# Patient Record
Sex: Female | Born: 1960 | Race: White | Hispanic: No | Marital: Married | Smoking: Never smoker
Health system: Southern US, Community
[De-identification: ages and names within clinical notes are randomized; demographics above are authoritative.]

## PROBLEM LIST (undated history)

## (undated) DIAGNOSIS — M069 Rheumatoid arthritis, unspecified: Secondary | ICD-10-CM

## (undated) DIAGNOSIS — R9389 Abnormal findings on diagnostic imaging of other specified body structures: Secondary | ICD-10-CM

## (undated) DIAGNOSIS — Z973 Presence of spectacles and contact lenses: Secondary | ICD-10-CM

## (undated) DIAGNOSIS — E079 Disorder of thyroid, unspecified: Secondary | ICD-10-CM

## (undated) HISTORY — PX: OTHER SURGICAL HISTORY: SHX169

---

## 1999-03-13 ENCOUNTER — Other Ambulatory Visit: Admission: RE | Admit: 1999-03-13 | Discharge: 1999-03-13 | Payer: Self-pay | Admitting: Obstetrics and Gynecology

## 2000-04-14 ENCOUNTER — Other Ambulatory Visit: Admission: RE | Admit: 2000-04-14 | Discharge: 2000-04-14 | Payer: Self-pay | Admitting: Obstetrics and Gynecology

## 2001-06-05 ENCOUNTER — Encounter: Payer: Self-pay | Admitting: Obstetrics and Gynecology

## 2001-06-05 ENCOUNTER — Encounter: Admission: RE | Admit: 2001-06-05 | Discharge: 2001-06-05 | Payer: Self-pay | Admitting: Obstetrics and Gynecology

## 2002-06-30 ENCOUNTER — Encounter: Payer: Self-pay | Admitting: Obstetrics and Gynecology

## 2002-06-30 ENCOUNTER — Encounter: Admission: RE | Admit: 2002-06-30 | Discharge: 2002-06-30 | Payer: Self-pay | Admitting: Obstetrics and Gynecology

## 2003-06-01 ENCOUNTER — Other Ambulatory Visit: Admission: RE | Admit: 2003-06-01 | Discharge: 2003-06-01 | Payer: Self-pay | Admitting: Obstetrics and Gynecology

## 2003-07-22 ENCOUNTER — Encounter: Admission: RE | Admit: 2003-07-22 | Discharge: 2003-07-22 | Payer: Self-pay | Admitting: Obstetrics and Gynecology

## 2004-07-18 ENCOUNTER — Other Ambulatory Visit: Admission: RE | Admit: 2004-07-18 | Discharge: 2004-07-18 | Payer: Self-pay | Admitting: Obstetrics and Gynecology

## 2004-08-01 ENCOUNTER — Encounter: Admission: RE | Admit: 2004-08-01 | Discharge: 2004-08-01 | Payer: Self-pay | Admitting: Obstetrics and Gynecology

## 2005-05-14 ENCOUNTER — Emergency Department (HOSPITAL_COMMUNITY): Admission: EM | Admit: 2005-05-14 | Discharge: 2005-05-15 | Payer: Self-pay | Admitting: Emergency Medicine

## 2005-07-23 ENCOUNTER — Other Ambulatory Visit: Admission: RE | Admit: 2005-07-23 | Discharge: 2005-07-23 | Payer: Self-pay | Admitting: Obstetrics and Gynecology

## 2005-08-12 ENCOUNTER — Encounter: Admission: RE | Admit: 2005-08-12 | Discharge: 2005-08-12 | Payer: Self-pay | Admitting: Obstetrics and Gynecology

## 2006-08-05 ENCOUNTER — Other Ambulatory Visit: Admission: RE | Admit: 2006-08-05 | Discharge: 2006-08-05 | Payer: Self-pay | Admitting: Obstetrics and Gynecology

## 2006-08-18 ENCOUNTER — Encounter: Admission: RE | Admit: 2006-08-18 | Discharge: 2006-08-18 | Payer: Self-pay | Admitting: Obstetrics and Gynecology

## 2007-08-06 ENCOUNTER — Other Ambulatory Visit: Admission: RE | Admit: 2007-08-06 | Discharge: 2007-08-06 | Payer: Self-pay | Admitting: Obstetrics and Gynecology

## 2007-08-19 ENCOUNTER — Encounter: Admission: RE | Admit: 2007-08-19 | Discharge: 2007-08-19 | Payer: Self-pay | Admitting: Obstetrics and Gynecology

## 2008-08-08 ENCOUNTER — Other Ambulatory Visit: Admission: RE | Admit: 2008-08-08 | Discharge: 2008-08-08 | Payer: Self-pay | Admitting: Obstetrics and Gynecology

## 2008-08-22 ENCOUNTER — Encounter: Admission: RE | Admit: 2008-08-22 | Discharge: 2008-08-22 | Payer: Self-pay | Admitting: Obstetrics and Gynecology

## 2009-08-24 ENCOUNTER — Encounter: Admission: RE | Admit: 2009-08-24 | Discharge: 2009-08-24 | Payer: Self-pay | Admitting: Obstetrics and Gynecology

## 2009-08-29 ENCOUNTER — Other Ambulatory Visit: Admission: RE | Admit: 2009-08-29 | Discharge: 2009-08-29 | Payer: Self-pay | Admitting: Obstetrics and Gynecology

## 2010-08-01 ENCOUNTER — Other Ambulatory Visit: Payer: Self-pay | Admitting: Obstetrics and Gynecology

## 2010-08-01 DIAGNOSIS — Z1231 Encounter for screening mammogram for malignant neoplasm of breast: Secondary | ICD-10-CM

## 2010-08-28 ENCOUNTER — Ambulatory Visit: Payer: Self-pay

## 2010-08-28 ENCOUNTER — Ambulatory Visit
Admission: RE | Admit: 2010-08-28 | Discharge: 2010-08-28 | Disposition: A | Discharge status: Home / Self Care | Payer: Self-pay | Source: Ambulatory Visit | Attending: Obstetrics and Gynecology | Admitting: Obstetrics and Gynecology

## 2010-08-28 DIAGNOSIS — Z1231 Encounter for screening mammogram for malignant neoplasm of breast: Secondary | ICD-10-CM

## 2010-10-26 ENCOUNTER — Other Ambulatory Visit (HOSPITAL_COMMUNITY)
Admission: RE | Admit: 2010-10-26 | Discharge: 2010-10-26 | Disposition: A | Discharge status: Home / Self Care | Payer: Managed Care, Other (non HMO) | Source: Ambulatory Visit | Attending: Obstetrics and Gynecology | Admitting: Obstetrics and Gynecology

## 2010-10-26 ENCOUNTER — Other Ambulatory Visit: Payer: Self-pay | Admitting: Obstetrics and Gynecology

## 2010-10-26 DIAGNOSIS — Z01419 Encounter for gynecological examination (general) (routine) without abnormal findings: Secondary | ICD-10-CM | POA: Insufficient documentation

## 2011-08-05 ENCOUNTER — Other Ambulatory Visit: Payer: Self-pay | Admitting: Obstetrics and Gynecology

## 2011-08-05 DIAGNOSIS — Z1231 Encounter for screening mammogram for malignant neoplasm of breast: Secondary | ICD-10-CM

## 2011-08-29 ENCOUNTER — Ambulatory Visit
Admission: RE | Admit: 2011-08-29 | Discharge: 2011-08-29 | Disposition: A | Discharge status: Home / Self Care | Payer: Managed Care, Other (non HMO) | Source: Ambulatory Visit | Attending: Obstetrics and Gynecology | Admitting: Obstetrics and Gynecology

## 2011-08-29 DIAGNOSIS — Z1231 Encounter for screening mammogram for malignant neoplasm of breast: Secondary | ICD-10-CM

## 2011-11-05 ENCOUNTER — Other Ambulatory Visit: Payer: Self-pay | Admitting: Obstetrics and Gynecology

## 2011-11-05 ENCOUNTER — Other Ambulatory Visit (HOSPITAL_COMMUNITY)
Admission: RE | Admit: 2011-11-05 | Discharge: 2011-11-05 | Disposition: A | Discharge status: Home / Self Care | Payer: Managed Care, Other (non HMO) | Source: Ambulatory Visit | Attending: Obstetrics and Gynecology | Admitting: Obstetrics and Gynecology

## 2011-11-05 DIAGNOSIS — Z01419 Encounter for gynecological examination (general) (routine) without abnormal findings: Secondary | ICD-10-CM | POA: Insufficient documentation

## 2012-08-03 ENCOUNTER — Other Ambulatory Visit: Payer: Self-pay

## 2012-08-03 DIAGNOSIS — Z1231 Encounter for screening mammogram for malignant neoplasm of breast: Secondary | ICD-10-CM

## 2012-09-02 ENCOUNTER — Ambulatory Visit: Payer: Managed Care, Other (non HMO)

## 2012-09-15 ENCOUNTER — Ambulatory Visit
Admission: RE | Admit: 2012-09-15 | Discharge: 2012-09-15 | Disposition: A | Discharge status: Home / Self Care | Payer: Managed Care, Other (non HMO) | Source: Ambulatory Visit

## 2012-09-15 DIAGNOSIS — Z1231 Encounter for screening mammogram for malignant neoplasm of breast: Secondary | ICD-10-CM

## 2012-11-05 ENCOUNTER — Other Ambulatory Visit: Payer: Self-pay | Admitting: Obstetrics and Gynecology

## 2012-11-05 ENCOUNTER — Other Ambulatory Visit (HOSPITAL_COMMUNITY)
Admission: RE | Admit: 2012-11-05 | Discharge: 2012-11-05 | Disposition: A | Discharge status: Home / Self Care | Payer: Managed Care, Other (non HMO) | Source: Ambulatory Visit | Attending: Obstetrics and Gynecology | Admitting: Obstetrics and Gynecology

## 2012-11-05 DIAGNOSIS — Z01419 Encounter for gynecological examination (general) (routine) without abnormal findings: Secondary | ICD-10-CM | POA: Insufficient documentation

## 2012-11-05 DIAGNOSIS — Z1151 Encounter for screening for human papillomavirus (HPV): Secondary | ICD-10-CM | POA: Insufficient documentation

## 2013-08-24 ENCOUNTER — Other Ambulatory Visit: Payer: Self-pay

## 2013-08-24 DIAGNOSIS — Z1231 Encounter for screening mammogram for malignant neoplasm of breast: Secondary | ICD-10-CM

## 2013-09-16 ENCOUNTER — Ambulatory Visit
Admission: RE | Admit: 2013-09-16 | Discharge: 2013-09-16 | Disposition: A | Discharge status: Home / Self Care | Payer: Managed Care, Other (non HMO) | Source: Ambulatory Visit

## 2013-09-16 ENCOUNTER — Encounter (INDEPENDENT_AMBULATORY_CARE_PROVIDER_SITE_OTHER): Payer: Self-pay

## 2013-09-16 DIAGNOSIS — Z1231 Encounter for screening mammogram for malignant neoplasm of breast: Secondary | ICD-10-CM

## 2013-11-05 ENCOUNTER — Other Ambulatory Visit (HOSPITAL_COMMUNITY)
Admission: RE | Admit: 2013-11-05 | Discharge: 2013-11-05 | Disposition: A | Discharge status: Home / Self Care | Payer: Managed Care, Other (non HMO) | Source: Ambulatory Visit | Attending: Obstetrics and Gynecology | Admitting: Obstetrics and Gynecology

## 2013-11-05 ENCOUNTER — Other Ambulatory Visit: Payer: Self-pay | Admitting: Obstetrics and Gynecology

## 2013-11-05 DIAGNOSIS — Z01419 Encounter for gynecological examination (general) (routine) without abnormal findings: Secondary | ICD-10-CM | POA: Insufficient documentation

## 2013-11-09 LAB — CYTOLOGY - PAP

## 2014-02-24 ENCOUNTER — Ambulatory Visit
Admission: RE | Admit: 2014-02-24 | Discharge: 2014-02-24 | Disposition: A | Discharge status: Home / Self Care | Payer: Managed Care, Other (non HMO) | Source: Ambulatory Visit | Attending: Family Medicine | Admitting: Family Medicine

## 2014-02-24 ENCOUNTER — Other Ambulatory Visit: Payer: Self-pay | Admitting: Family Medicine

## 2014-02-24 DIAGNOSIS — R590 Localized enlarged lymph nodes: Secondary | ICD-10-CM

## 2014-02-24 MED ORDER — IOHEXOL 300 MG/ML  SOLN
75.0000 mL | Freq: Once | INTRAMUSCULAR | Status: AC | PRN
  Administered 2014-02-24: 75 mL via INTRAVENOUS

## 2014-08-15 ENCOUNTER — Other Ambulatory Visit: Payer: Self-pay

## 2014-08-15 DIAGNOSIS — Z1231 Encounter for screening mammogram for malignant neoplasm of breast: Secondary | ICD-10-CM

## 2014-09-21 ENCOUNTER — Ambulatory Visit
Admission: RE | Admit: 2014-09-21 | Discharge: 2014-09-21 | Disposition: A | Discharge status: Home / Self Care | Payer: 59 | Source: Ambulatory Visit

## 2014-09-21 ENCOUNTER — Encounter (INDEPENDENT_AMBULATORY_CARE_PROVIDER_SITE_OTHER): Payer: Self-pay

## 2014-09-21 DIAGNOSIS — Z1231 Encounter for screening mammogram for malignant neoplasm of breast: Secondary | ICD-10-CM

## 2015-08-22 ENCOUNTER — Other Ambulatory Visit: Payer: Self-pay

## 2015-08-22 DIAGNOSIS — Z1231 Encounter for screening mammogram for malignant neoplasm of breast: Secondary | ICD-10-CM

## 2015-10-03 ENCOUNTER — Ambulatory Visit
Admission: RE | Admit: 2015-10-03 | Discharge: 2015-10-03 | Disposition: A | Discharge status: Home / Self Care | Payer: 59 | Source: Ambulatory Visit

## 2015-10-03 DIAGNOSIS — Z1231 Encounter for screening mammogram for malignant neoplasm of breast: Secondary | ICD-10-CM

## 2016-08-23 ENCOUNTER — Other Ambulatory Visit: Payer: Self-pay | Admitting: Obstetrics and Gynecology

## 2016-08-23 DIAGNOSIS — Z1231 Encounter for screening mammogram for malignant neoplasm of breast: Secondary | ICD-10-CM

## 2016-10-23 ENCOUNTER — Ambulatory Visit
Admission: RE | Admit: 2016-10-23 | Discharge: 2016-10-23 | Disposition: A | Discharge status: Home / Self Care | Payer: 59 | Source: Ambulatory Visit | Attending: Obstetrics and Gynecology | Admitting: Obstetrics and Gynecology

## 2016-10-23 DIAGNOSIS — Z1231 Encounter for screening mammogram for malignant neoplasm of breast: Secondary | ICD-10-CM

## 2016-11-06 ENCOUNTER — Other Ambulatory Visit (HOSPITAL_COMMUNITY)
Admission: RE | Admit: 2016-11-06 | Discharge: 2016-11-06 | Disposition: A | Discharge status: Home / Self Care | Payer: 59 | Source: Ambulatory Visit | Attending: Obstetrics and Gynecology | Admitting: Obstetrics and Gynecology

## 2016-11-06 ENCOUNTER — Other Ambulatory Visit: Payer: Self-pay | Admitting: Obstetrics and Gynecology

## 2016-11-06 DIAGNOSIS — Z01419 Encounter for gynecological examination (general) (routine) without abnormal findings: Secondary | ICD-10-CM | POA: Diagnosis not present

## 2016-11-07 LAB — CYTOLOGY - PAP
DIAGNOSIS: NEGATIVE
HPV: NOT DETECTED

## 2017-09-15 ENCOUNTER — Other Ambulatory Visit: Payer: Self-pay | Admitting: Obstetrics and Gynecology

## 2017-09-15 DIAGNOSIS — Z1231 Encounter for screening mammogram for malignant neoplasm of breast: Secondary | ICD-10-CM

## 2017-10-24 ENCOUNTER — Ambulatory Visit
Admission: RE | Admit: 2017-10-24 | Discharge: 2017-10-24 | Disposition: A | Discharge status: Home / Self Care | Payer: 59 | Source: Ambulatory Visit | Attending: Obstetrics and Gynecology | Admitting: Obstetrics and Gynecology

## 2017-10-24 DIAGNOSIS — Z1231 Encounter for screening mammogram for malignant neoplasm of breast: Secondary | ICD-10-CM

## 2018-09-18 ENCOUNTER — Other Ambulatory Visit: Payer: Self-pay | Admitting: Obstetrics and Gynecology

## 2018-09-18 DIAGNOSIS — Z1231 Encounter for screening mammogram for malignant neoplasm of breast: Secondary | ICD-10-CM

## 2018-10-27 ENCOUNTER — Ambulatory Visit: Payer: 59

## 2018-10-27 ENCOUNTER — Ambulatory Visit
Admission: RE | Admit: 2018-10-27 | Discharge: 2018-10-27 | Disposition: A | Discharge status: Home / Self Care | Payer: 59 | Source: Ambulatory Visit | Attending: Obstetrics and Gynecology | Admitting: Obstetrics and Gynecology

## 2018-10-27 ENCOUNTER — Other Ambulatory Visit: Payer: Self-pay

## 2018-10-27 DIAGNOSIS — Z1231 Encounter for screening mammogram for malignant neoplasm of breast: Secondary | ICD-10-CM

## 2018-10-29 ENCOUNTER — Other Ambulatory Visit: Payer: Self-pay | Admitting: Obstetrics and Gynecology

## 2018-10-29 DIAGNOSIS — R928 Other abnormal and inconclusive findings on diagnostic imaging of breast: Secondary | ICD-10-CM

## 2018-10-30 ENCOUNTER — Ambulatory Visit: Payer: 59

## 2018-10-30 ENCOUNTER — Ambulatory Visit
Admission: RE | Admit: 2018-10-30 | Discharge: 2018-10-30 | Disposition: A | Discharge status: Home / Self Care | Payer: 59 | Source: Ambulatory Visit | Attending: Obstetrics and Gynecology | Admitting: Obstetrics and Gynecology

## 2018-10-30 ENCOUNTER — Other Ambulatory Visit: Payer: Self-pay

## 2018-10-30 DIAGNOSIS — R928 Other abnormal and inconclusive findings on diagnostic imaging of breast: Secondary | ICD-10-CM

## 2019-09-21 ENCOUNTER — Other Ambulatory Visit: Payer: Self-pay | Admitting: Obstetrics and Gynecology

## 2019-09-21 DIAGNOSIS — Z1231 Encounter for screening mammogram for malignant neoplasm of breast: Secondary | ICD-10-CM

## 2019-10-28 ENCOUNTER — Ambulatory Visit
Admission: RE | Admit: 2019-10-28 | Discharge: 2019-10-28 | Disposition: A | Discharge status: Home / Self Care | Payer: 59 | Source: Ambulatory Visit | Attending: Obstetrics and Gynecology | Admitting: Obstetrics and Gynecology

## 2019-10-28 ENCOUNTER — Other Ambulatory Visit: Payer: Self-pay

## 2019-10-28 DIAGNOSIS — Z1231 Encounter for screening mammogram for malignant neoplasm of breast: Secondary | ICD-10-CM

## 2019-11-01 ENCOUNTER — Other Ambulatory Visit: Payer: Self-pay | Admitting: Obstetrics and Gynecology

## 2019-11-01 DIAGNOSIS — R928 Other abnormal and inconclusive findings on diagnostic imaging of breast: Secondary | ICD-10-CM

## 2019-11-12 ENCOUNTER — Ambulatory Visit: Payer: 59

## 2019-11-12 ENCOUNTER — Other Ambulatory Visit: Payer: Self-pay

## 2019-11-12 ENCOUNTER — Ambulatory Visit
Admission: RE | Admit: 2019-11-12 | Discharge: 2019-11-12 | Disposition: A | Discharge status: Home / Self Care | Payer: 59 | Source: Ambulatory Visit | Attending: Obstetrics and Gynecology | Admitting: Obstetrics and Gynecology

## 2019-11-12 DIAGNOSIS — R928 Other abnormal and inconclusive findings on diagnostic imaging of breast: Secondary | ICD-10-CM

## 2020-08-01 IMAGING — MG DIGITAL SCREENING BILATERAL MAMMOGRAM WITH TOMO AND CAD
8 series · 8 of 24 positions shown · non-contrast
Comparison: Previous exam(s).

CLINICAL DATA: Screening.

EXAM:
DIGITAL SCREENING BILATERAL MAMMOGRAM WITH TOMO AND CAD

[L MLO synth-2D]
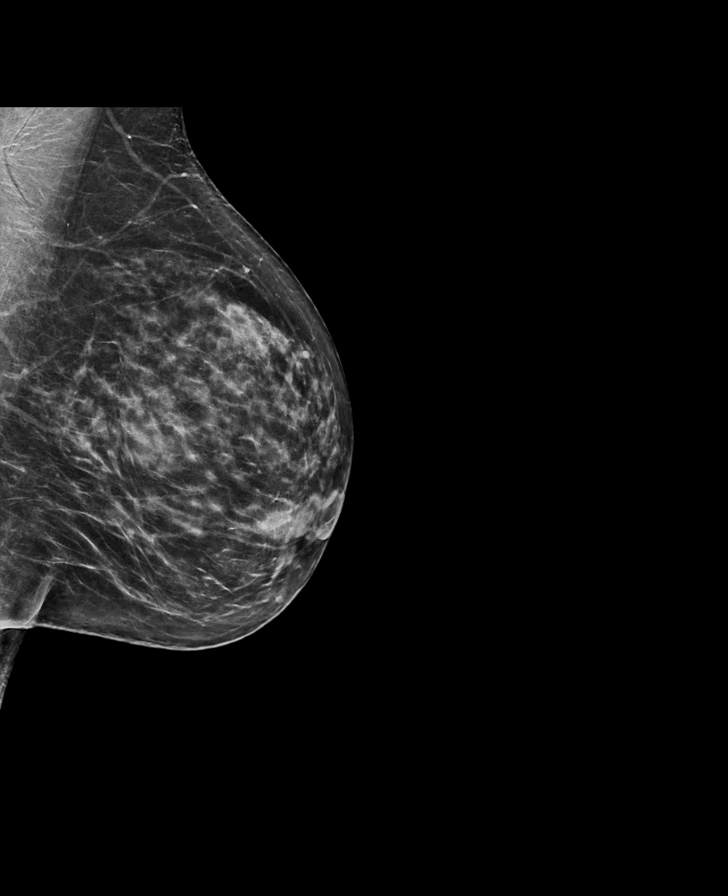

[R CC synth-2D]
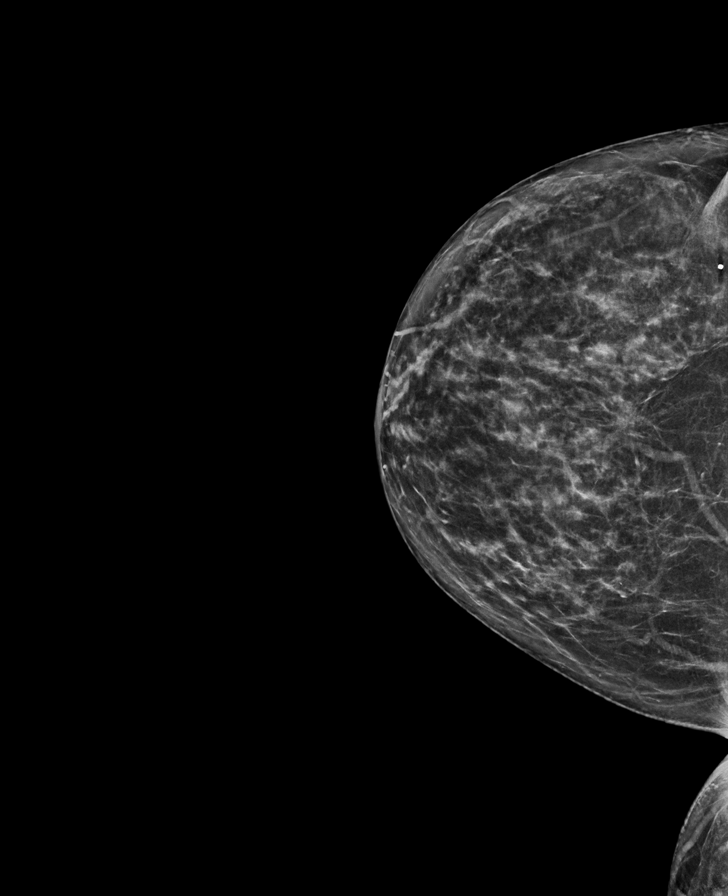

[R MLO synth-2D]
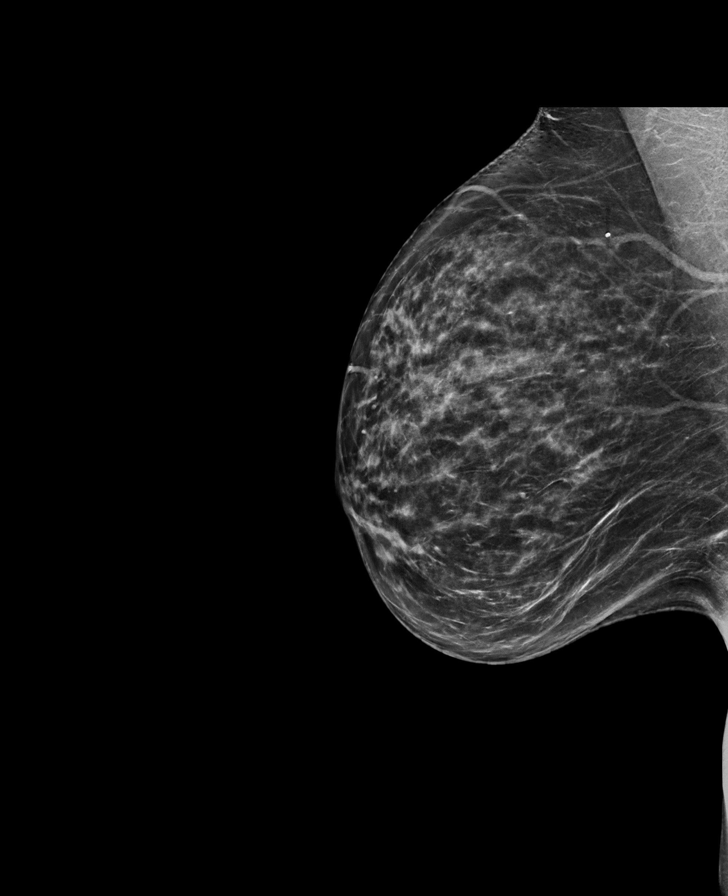

[L CC synth-2D]
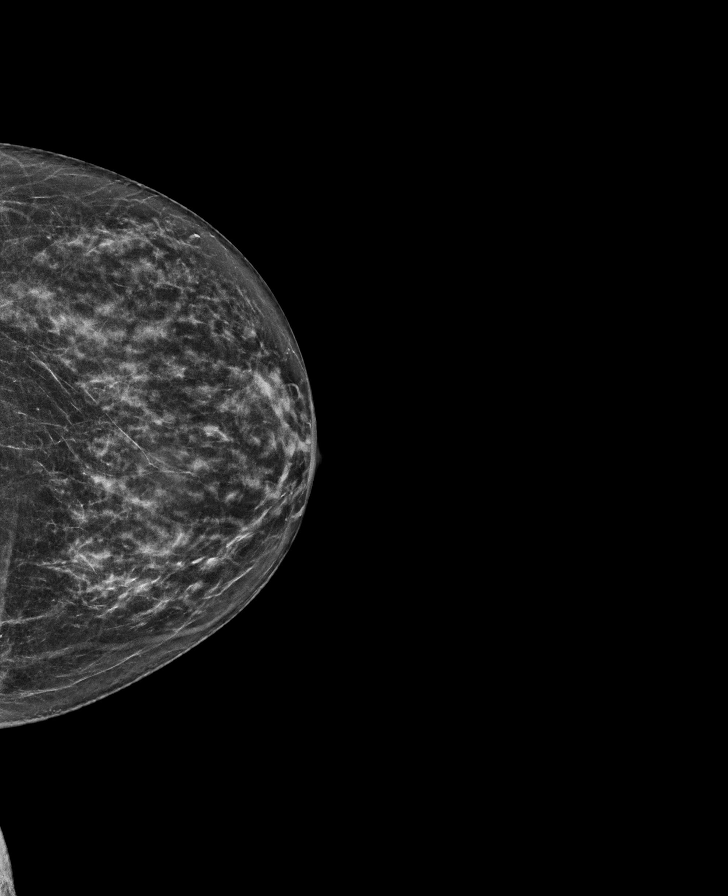

[R CC tomo · tomo slice 34/67.0]
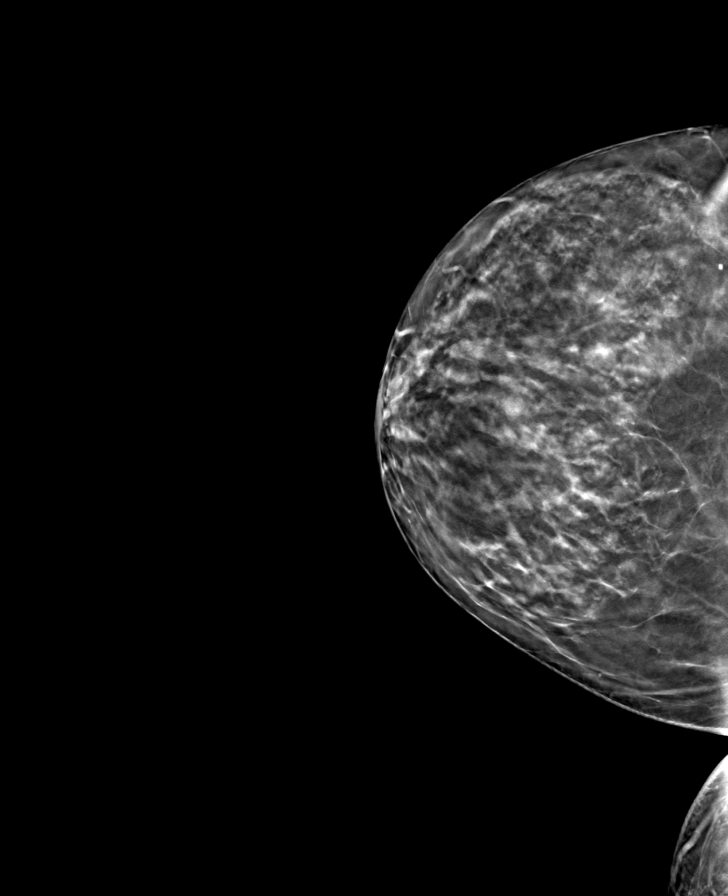

[R MLO tomo · tomo slice 35/69.0]
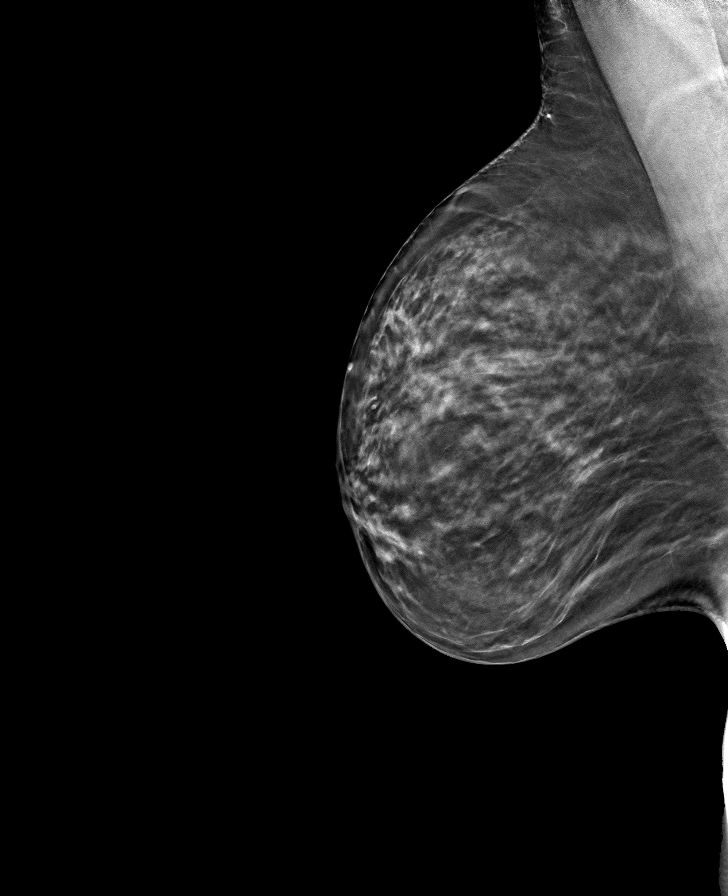

[L CC tomo · tomo slice 30/59.0]
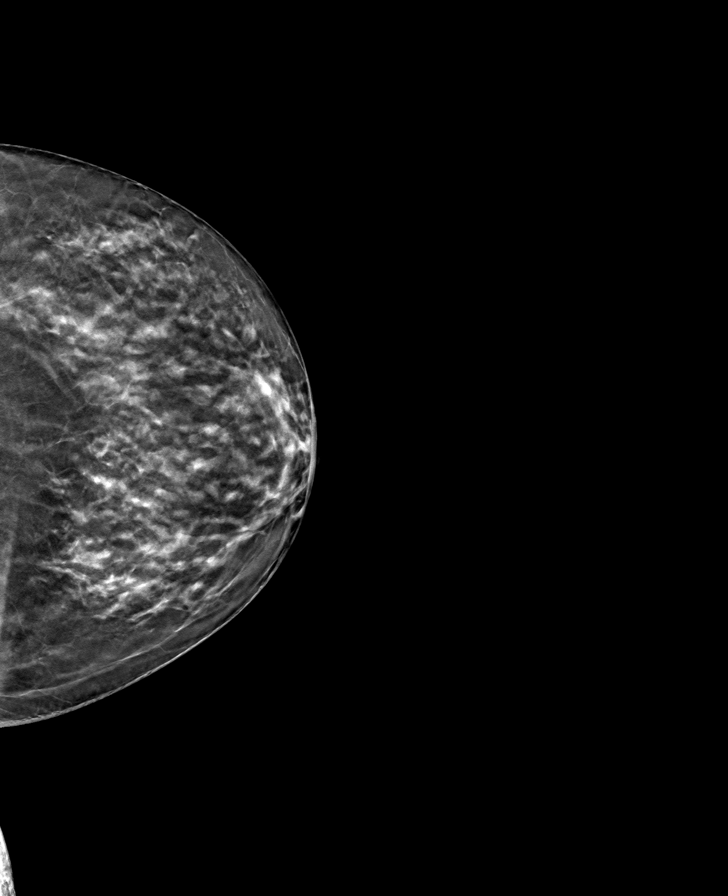

[L MLO tomo · tomo slice 34/67.0]
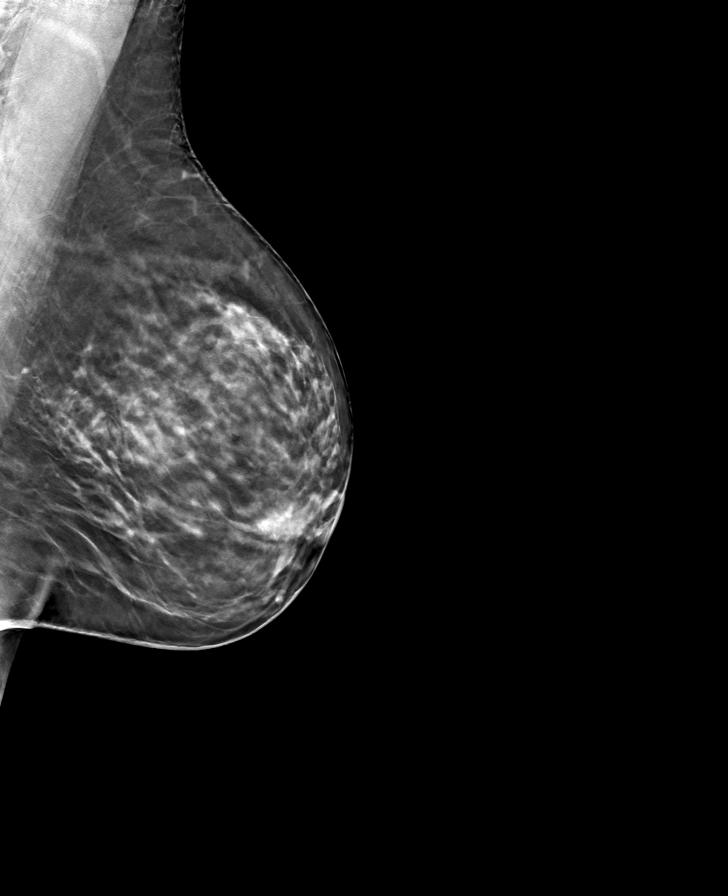

[8 of 24 positions shown; findings below may reference images not displayed]

ACR Breast Density Category c: The breast tissue is heterogeneously
dense, which may obscure small masses.
FINDINGS: In the right breast, possible distortion warrants further
evaluation. In the left breast, no findings suspicious for
malignancy. Images were processed with CAD.
IMPRESSION: Further evaluation is suggested for possible distortion in the right
breast.

RECOMMENDATION:
Diagnostic mammogram and possibly ultrasound of the right breast.
(Code:[id number])

The patient will be contacted regarding the findings, and additional
imaging will be scheduled.

BI-RADS CATEGORY  0: Incomplete. Need additional imaging evaluation
and/or prior mammograms for comparison.

## 2020-08-04 IMAGING — MG DIGITAL DIAGNOSTIC UNILATERAL RIGHT MAMMOGRAM WITH TOMO AND CAD
4 series · 4 of 12 positions shown · non-contrast
Comparison: Previous exam(s).

CLINICAL DATA: Patient was called back from screening mammogram for
possible distortion in the right breast.

EXAM:
DIGITAL DIAGNOSTIC UNILATERAL RIGHT MAMMOGRAM WITH CAD AND TOMO

[R ML synth-2D]
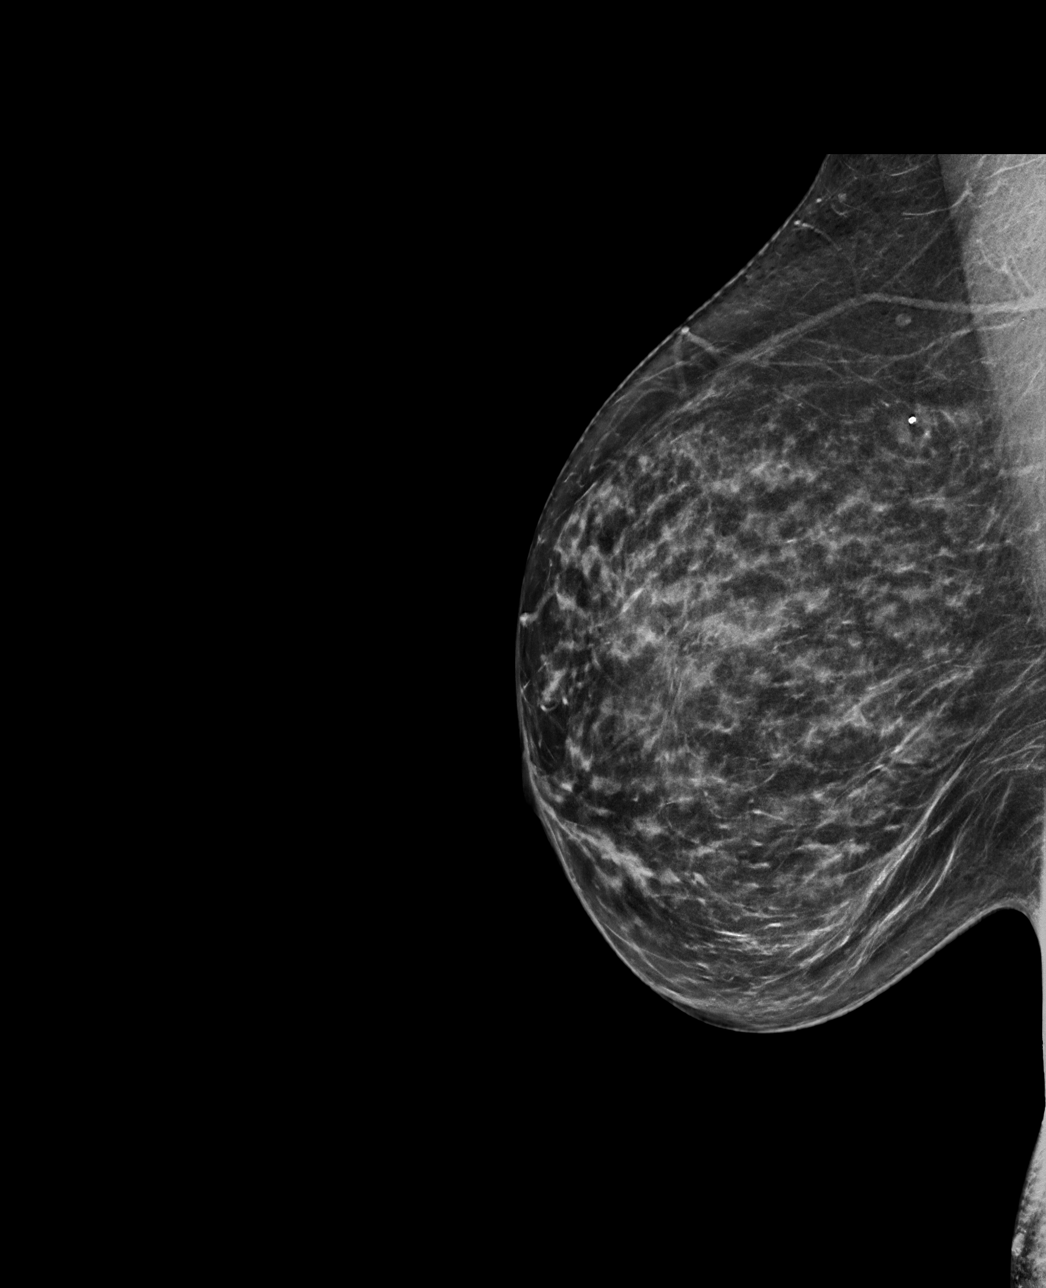

[R CC synth-2D]
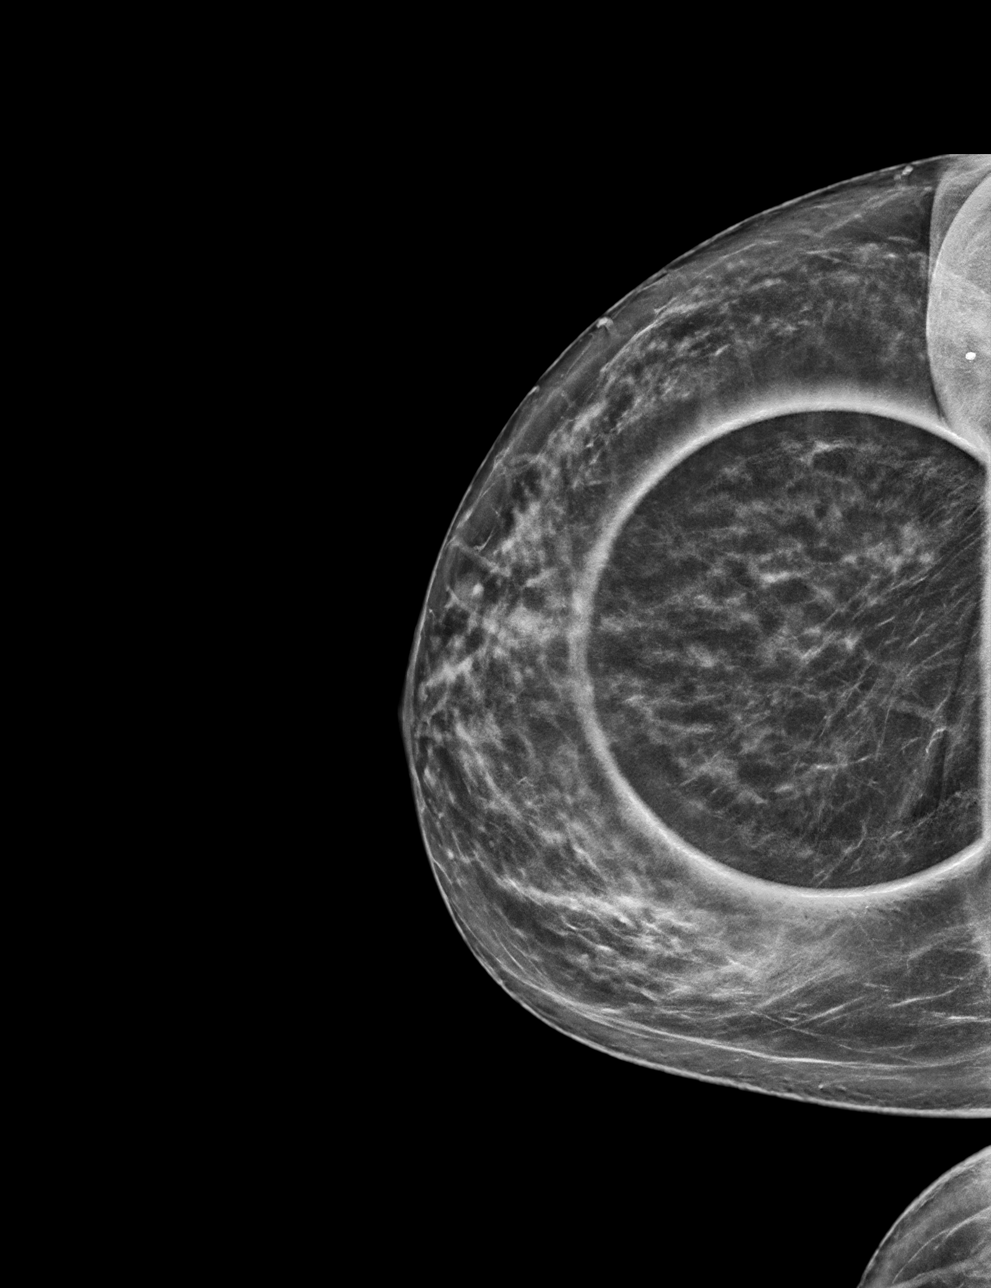

[R ML tomo · tomo slice 35/70.0]
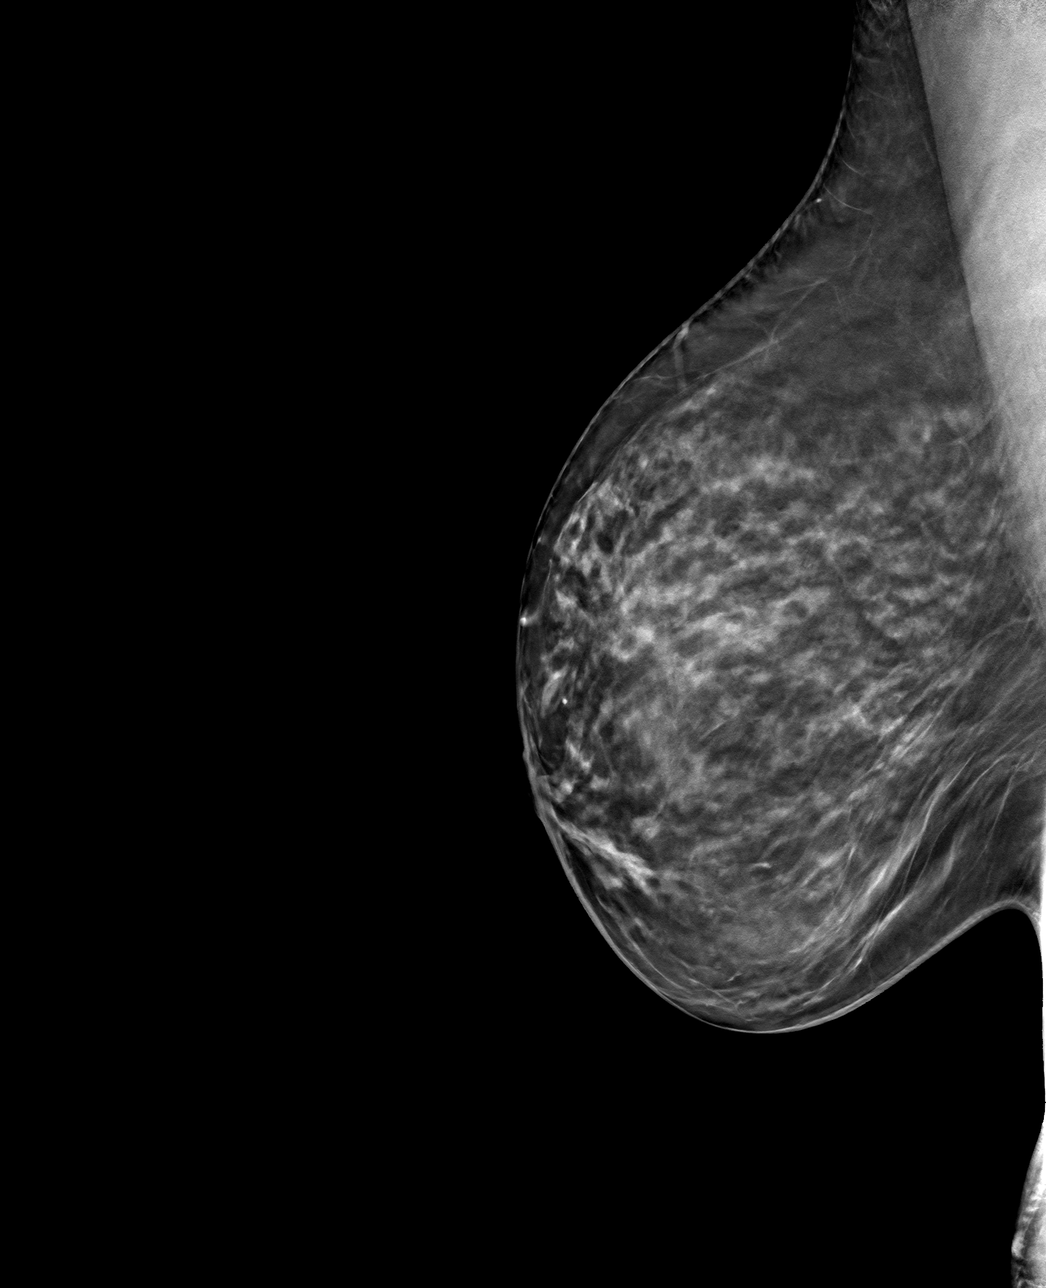

[R CC tomo · tomo slice 33/65.0]
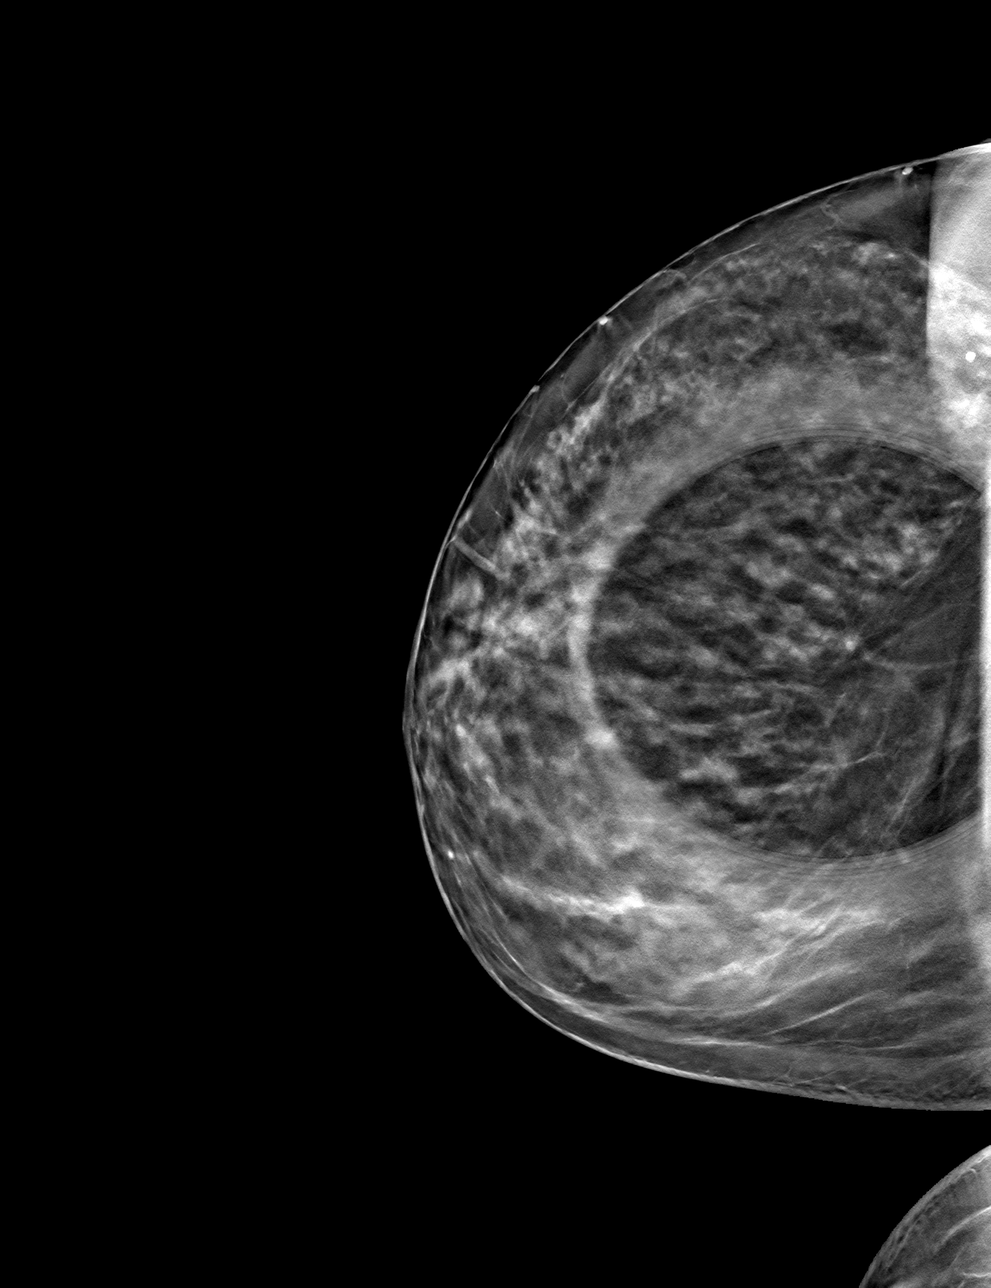

[4 of 12 positions shown; findings below may reference images not displayed]

ACR Breast Density Category c: The breast tissue is heterogeneously
dense, which may obscure small masses.
FINDINGS: Additional imaging of the right breast was performed. No persistent
mass, distortion or malignant type microcalcifications identified.

Mammographic images were processed with CAD.
IMPRESSION: No evidence of malignancy in the right breast.

RECOMMENDATION:
Bilateral screening mammogram in 1 year is recommended.

I have discussed the findings and recommendations with the patient.
Results were also provided in writing at the conclusion of the
visit. If applicable, a reminder letter will be sent to the patient
regarding the next appointment.

BI-RADS CATEGORY  1: Negative.

## 2020-10-03 ENCOUNTER — Other Ambulatory Visit: Payer: Self-pay | Admitting: Obstetrics and Gynecology

## 2020-10-03 DIAGNOSIS — Z1231 Encounter for screening mammogram for malignant neoplasm of breast: Secondary | ICD-10-CM

## 2020-11-28 ENCOUNTER — Ambulatory Visit
Admission: RE | Admit: 2020-11-28 | Discharge: 2020-11-28 | Disposition: A | Discharge status: Home / Self Care | Payer: 59 | Source: Ambulatory Visit | Attending: Obstetrics and Gynecology | Admitting: Obstetrics and Gynecology

## 2020-11-28 ENCOUNTER — Other Ambulatory Visit: Payer: Self-pay

## 2020-11-28 DIAGNOSIS — Z1231 Encounter for screening mammogram for malignant neoplasm of breast: Secondary | ICD-10-CM

## 2021-10-22 ENCOUNTER — Other Ambulatory Visit: Payer: Self-pay | Admitting: Obstetrics and Gynecology

## 2021-10-22 DIAGNOSIS — Z1231 Encounter for screening mammogram for malignant neoplasm of breast: Secondary | ICD-10-CM

## 2021-12-11 ENCOUNTER — Ambulatory Visit
Admission: RE | Admit: 2021-12-11 | Discharge: 2021-12-11 | Disposition: A | Discharge status: Home / Self Care | Payer: Managed Care, Other (non HMO) | Source: Ambulatory Visit | Attending: Obstetrics and Gynecology | Admitting: Obstetrics and Gynecology

## 2021-12-11 DIAGNOSIS — Z1231 Encounter for screening mammogram for malignant neoplasm of breast: Secondary | ICD-10-CM

## 2022-09-06 ENCOUNTER — Encounter (HOSPITAL_BASED_OUTPATIENT_CLINIC_OR_DEPARTMENT_OTHER): Payer: Self-pay | Admitting: Obstetrics and Gynecology

## 2022-09-09 ENCOUNTER — Other Ambulatory Visit: Payer: Self-pay

## 2022-09-09 ENCOUNTER — Encounter (HOSPITAL_BASED_OUTPATIENT_CLINIC_OR_DEPARTMENT_OTHER): Payer: Self-pay | Admitting: Obstetrics and Gynecology

## 2022-09-09 NOTE — Progress Notes (Addendum)
Spoke w/ via phone for pre-op interview---pt Lab needs dos----  none             Lab results------none COVID test -----patient states asymptomatic no test needed Arrive at -------1345 pm 09-25-2022 No solid food after midnight, clear liquids from midnight until 1245 pm Medications to take am of surgery: NP Thyroid, estrogen, testesterone instructed to bring photo id and insurance card day of surgery Patient aware to have Driver (ride ) / caregiver husband or daughter    for 24 hours after surgery  Patient Special Instructions -----none Pre-Op special Instructions -----none Patient verbalized understanding of instructions that were given at this phone interview. Patient denies shortness of breath, chest pain, fever, cough at this phone interview.

## 2022-09-24 ENCOUNTER — Other Ambulatory Visit: Payer: Self-pay | Admitting: Obstetrics and Gynecology

## 2022-09-24 DIAGNOSIS — N950 Postmenopausal bleeding: Secondary | ICD-10-CM

## 2022-09-24 NOTE — H&P (Signed)
CC   1.  Pelvic US History of Present Illness    General:  62 y/o presents  for hysteroscopy D&C removal of endometrial mass with myosure.  she reports vaginal bleeding that she noticed a month ago.. she has seen the blood on her pantiliner. it is is a spot the size of a quarter when she changes it . she changes the pantiliner 2-3 times throughout the day.  Pelvic ultrasound 08/28/2022 reveals a uterus that measures 9.0 cm x5.7 cm x 5.6 cm Her endometrium is thickened at 1.00 cm. with a hyperechoic mass 1.5 cm x 1.0 cm x 0.7 cm....  4 fibroids are measured the largest is 3.2 cm.  Current Medications Taking Progesterone 100 MG Capsule 2 capsules Orally in the evening Clinoril 200 MG Dr. Beekman Tablet 1 tablet with food Orally Once a day Plaquenil 200 MG Dr Beekman Tablet 1 tablet Orally Once a day Vitamin D . Tablet 1 tablet Orally three days a week Vitamin K2 100 MCG Capsule 1 capsule Orally once a day Zinc 50 MG Tablet 1 tablet Orally Once a day Magnesium . Capsule 3 capsules Orally Once a day Vitamin B12 . Tablet 1 tablet Orally twice a week DHEA 10 MG Tablet 1 capsule Orally three times a week Biest/Progesterone , Notes to Pharmacist: Sublingual ,Twice a day NP Thyroid(Thyroid) 60 MG Tablet 1 tablet on an empty stomach Orally Once a day Omega 3 350 MG Capsule 1 capsule Orally Once a day Berberine Complex(Barberry-Oreg Grape-Goldenseal) 200-200-50 MG Capsule 1 capsule orally twice a day Testosterone , Notes to Pharmacist: Sublingual, twice a day Not-Taking Minivelle 0.05 mg/day . Patch apply transdermally Twice weekly Typhoid Vaccine - Capsule Delayed Release 1 capsule 1 hour before a meal for 4 doses Orally take every other day , Notes to Pharmacist: any issues let me know Medication List reviewed and reconciled with the patient Past Medical History Rheumatoid arthritis dx age 32. Thyroid issues. 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 Surgical  History 1ST Bone spur left foot 2nd bone spur left foot 12/2010 colonoscopy 02/14/11- 2024 Family History Father: alive 82 yrs, colon polyp Mother: alive 84 yrs, Fibromyalgia, hypothyroid, migraines, bladder cancer 06/2021 lives in [place] (went to [facility]) now on BCG treatment Paternal Grand Father: deceased 76 yrs, lung cancer smoker, skin cancer Paternal Grand Mother: deceased, dementia Maternal Grand Father: deceased 63 yrs, Guillian Barre syndrome, respiratory failure Maternal Grand Mother: deceased 75 yrs, lung cancer, heavy smoker Sister 1: alive, healthy, abnormal paps 1 sister(s) . 1 daughter(s) - healthy. denies family h/o gyn cancers daughter with Hashmoto's IBS , migraines Negative family history colon cancer or liver disease. Social History    General:  Tobacco use  cigarettes:  Never smoked, Tobacco history last updated  08/28/2022, Vaping  No. EXPOSURE TO PASSIVE SMOKE: no. Alcohol: yes, wine,4-5 per week. Caffeine: yes, coffee, 1 serving daily. Recreational drug use: no. DIET: regular. Exercise: yes, 3x per week. Marital Status: married, Married, 1987 Tom. Children: girls, 1, Elizabeth. EDUCATION: yes, College Grad, [employer], [employer]. OCCUPATION: employed, financial planner, [employer]. Religion: [employer]. Seat belt use: yes. Tobacco Exposure: no. Gyn History Sexual activity currently sexually active.  Periods : postmenopausal.  LMP PMB- 08/21/2022.  Denies H/O Birth control.  Last pap smear date 11/18/2019 - normal/HPV neg.  Last mammogram date 12/11/2021- neg.  Denies H/O Abnormal pap smear.  Denies H/O STD.  Menarche 13.  Other: Colonoscopy on 02/04/2011 showed tortuous colon.   Repeat in 10 years.  OB History Number of pregnancies  1.  Pregnancy # 1  live birth, girl, vaginal delivery.  Allergies N.K.D.A. Hospitalization/Major Diagnostic Procedure childbirth x 1 NVD Review of Systems    CONSTITUTIONAL:  Chills No.  Fatigue No.   Fever No.  Night sweats No.  Recent travel outside [place] No.  Sweats No.  Weight change No.     OPHTHALMOLOGY:  Blurring of vision no.  Change in vision no.  Double vision no.     ENT:  Dizziness no.  Nose bleeds no.  Sore throat no.  Teeth pain no.     ALLERGY:  Hives no.     CARDIOLOGY:  Chest pain no.  High blood pressure no.  Irregular heart beat no.  Leg edema no.  Palpitations no.     RESPIRATORY:  Shortness of breath no.  Cough no.  Wheezing no.     UROLOGY:  Pain with urination no.  Urinary urgency no.  Urinary frequency no.  Urinary incontinence no.  Difficulty urinating No.  Blood in urine No.     GASTROENTEROLOGY:  Abdominal pain no.  Appetite change no.  Bloating/belching no.  Blood in stool or on toilet paper no.  Change in bowel movements no.  Constipation no.  Diarrhea no.  Difficulty swallowing no.  Nausea no.     FEMALE REPRODUCTIVE:  Vulvar pain no.  Vulvar rash no.  Abnormal vaginal bleeding , yes.  Breast pain no.  Nipple discharge no.  Pain with intercourse no.  Pelvic pain no.  Unusual vaginal discharge no.  Vaginal itching no.     MUSCULOSKELETAL:  Muscle aches no.     NEUROLOGY:  Headache no.  Tingling/numbness no.  Weakness no.     PSYCHOLOGY:  Depression no.  Anxiety no.  Nervousness no.  Sleep disturbances no.  Suicidal ideation no .     ENDOCRINOLOGY:  Excessive thirst no.  Excessive urination no.  Hair loss no.  Heat or cold intolerance no.     HEMATOLOGY/LYMPH:  Abnormal bleeding no.  Easy bruising no.  Swollen glands no.     DERMATOLOGY:  New/changing skin lesion no.  Rash no.  Sores no.  Vital Signs Wt: 147.6, Wt change: -2.6 lbs, Ht: 62, BMI: 26.99, Pulse sitting: 74, BP sitting: 129/83. Examination    General Examination: CONSTITUTIONAL:  alert, oriented, NAD.  SKIN:  moist, warm.  EYES:  Conjunctiva clear.  LUNGS:  good I:E efffort noted, clear to auscultation bilaterally.  HEART:  regular rate and rhythm.  ABDOMEN:  soft,  non-tender/non-distended, bowel sounds present.  FEMALE GENITOURINARY:  normal external genitalia, labia - unremarkable, vagina - pink moist mucosa, no lesions or abnormal discharge, cervix - no discharge or lesions or CMT, adnexa - no masses or tenderness, uterus - nontender and normal size on palpation.  EXTREMITIES:  no edema present.  PSYCH:  affect normal, good eye contact.  Assessments 1. Postmenopausal bleeding - N95.0 (Primary) 2. Thickened endometrium - R93.89 3. Endometrial mass - N94.89 Treatment 1. Postmenopausal bleeding      IMAGING: US ECHO TRANSVAGINAL     Notes: given her ultrasound results recommend. hysteroscopy D/C removal of encometrial mass ( ie polypectomy) with myosure. Pt is advised she will be able to return home the same day if she is doing well. Discussed risks of hysteroscopy including but not limited to infection, bleeding, possible perforation of the uterus, with the need for further surgery. Pt advised to avoid NSAIDs (Aspirin, Aleve,   Advil, Ibuprofen, Motrin) from now until surgery given risk of bleeding during surgery. She may take Tylenol for pain management. She is advised to avoid eating or drinking starting midnight prior to surgery. Discussed post-surgery avoidance of driving for 24 hours or intercourse for 2 weeks after procedure.            Referral To:               Reason:please check insurance coverage of hysteroscopy dilation and curettage removal of endometrial mass with myosure 2. Thickened endometrium        Notes: given her ultrasound results recommend. hysteroscopy D/C removal of encometrial mass ( ie polypectomy) with myosure. Pt is advised she will be able to return home the same day if she is doing well. Discussed risks of hysteroscopy including but not limited to infection, bleeding, possible perforation of the uterus, with the need for further surgery. Pt advised to avoid NSAIDs (Aspirin, Aleve, Advil, Ibuprofen, Motrin) from now until  surgery given risk of bleeding during surgery. She may take Tylenol for pain management. She is advised to avoid eating or drinking starting midnight prior to surgery. Discussed post-surgery avoidance of driving for 24 hours or intercourse for 2 weeks after procedure.            Referral To:               Reason:please check insurance coverage of hysteroscopy dilation and curettage removal of endometrial mass with myosure 3. Endometrial mass        Notes: given her ultrasound results recommend. hysteroscopy D/C removal of encometrial mass ( ie polypectomy) with myosure. Pt is advised she will be able to return home the same day if she is doing well. Discussed risks of hysteroscopy including but not limited to infection, bleeding, possible perforation of the uterus, with the need for further surgery. Pt advised to avoid NSAIDs (Aspirin, Aleve, Advil, Ibuprofen, Motrin) from now until surgery given risk of bleeding during surgery. She may take Tylenol for pain management. She is advised to avoid eating or drinking starting midnight prior to surgery. Discussed post-surgery avoidance of driving for 24 hours or intercourse for 2 weeks after procedure.            

## 2022-09-24 NOTE — H&P (Deleted)
  The note originally documented on this encounter has been moved the the encounter in which it belongs.  

## 2022-09-25 ENCOUNTER — Encounter (HOSPITAL_BASED_OUTPATIENT_CLINIC_OR_DEPARTMENT_OTHER): Payer: Self-pay | Admitting: Obstetrics and Gynecology

## 2022-09-25 ENCOUNTER — Ambulatory Visit (HOSPITAL_BASED_OUTPATIENT_CLINIC_OR_DEPARTMENT_OTHER)
Admission: RE | Admit: 2022-09-25 | Discharge: 2022-09-25 | Disposition: A | Discharge status: Home / Self Care | Payer: Managed Care, Other (non HMO) | Attending: Obstetrics and Gynecology | Admitting: Obstetrics and Gynecology

## 2022-09-25 ENCOUNTER — Ambulatory Visit (HOSPITAL_BASED_OUTPATIENT_CLINIC_OR_DEPARTMENT_OTHER): Payer: Managed Care, Other (non HMO) | Admitting: Anesthesiology

## 2022-09-25 ENCOUNTER — Encounter (HOSPITAL_BASED_OUTPATIENT_CLINIC_OR_DEPARTMENT_OTHER)
Admission: RE | Disposition: A | Discharge status: Home / Self Care | Payer: Self-pay | Source: Home / Self Care | Attending: Obstetrics and Gynecology

## 2022-09-25 ENCOUNTER — Other Ambulatory Visit: Payer: Self-pay

## 2022-09-25 DIAGNOSIS — N950 Postmenopausal bleeding: Secondary | ICD-10-CM | POA: Diagnosis present

## 2022-09-25 DIAGNOSIS — N8500 Endometrial hyperplasia, unspecified: Secondary | ICD-10-CM | POA: Diagnosis not present

## 2022-09-25 DIAGNOSIS — M1990 Unspecified osteoarthritis, unspecified site: Secondary | ICD-10-CM

## 2022-09-25 DIAGNOSIS — R9389 Abnormal findings on diagnostic imaging of other specified body structures: Secondary | ICD-10-CM | POA: Diagnosis not present

## 2022-09-25 DIAGNOSIS — M069 Rheumatoid arthritis, unspecified: Secondary | ICD-10-CM | POA: Diagnosis not present

## 2022-09-25 DIAGNOSIS — Z01812 Encounter for preprocedural laboratory examination: Secondary | ICD-10-CM

## 2022-09-25 HISTORY — PX: DILATATION & CURETTAGE/HYSTEROSCOPY WITH MYOSURE: SHX6511

## 2022-09-25 HISTORY — DX: Abnormal findings on diagnostic imaging of other specified body structures: R93.89

## 2022-09-25 HISTORY — DX: Rheumatoid arthritis, unspecified: M06.9

## 2022-09-25 HISTORY — DX: Disorder of thyroid, unspecified: E07.9

## 2022-09-25 HISTORY — DX: Presence of spectacles and contact lenses: Z97.3

## 2022-09-25 LAB — BASIC METABOLIC PANEL
Anion gap: 9 (ref 5–15)
BUN: 16 mg/dL (ref 8–23)
CO2: 21 mmol/L — ABNORMAL LOW (ref 22–32)
Calcium: 8.4 mg/dL — ABNORMAL LOW (ref 8.9–10.3)
Chloride: 107 mmol/L (ref 98–111)
Creatinine, Ser: 0.6 mg/dL (ref 0.44–1.00)
GFR, Estimated: >60 mL/min (ref >60)
Glucose, Bld: 103 mg/dL — ABNORMAL HIGH (ref 70–99)
Potassium: 4 mmol/L (ref 3.5–5.1)
Sodium: 137 mmol/L (ref 135–145)

## 2022-09-25 SURGERY — DILATATION & CURETTAGE/HYSTEROSCOPY WITH MYOSURE
Anesthesia: General

## 2022-09-25 MED ORDER — SODIUM CHLORIDE 0.9 % IR SOLN
Status: DC | PRN
  Administered 2022-09-25: 3000 mL

## 2022-09-25 MED ORDER — DEXAMETHASONE SODIUM PHOSPHATE 10 MG/ML IJ SOLN
INTRAMUSCULAR | Status: DC | PRN
  Administered 2022-09-25: 5 mg via INTRAVENOUS

## 2022-09-25 MED ORDER — ONDANSETRON HCL 4 MG/2ML IJ SOLN
INTRAMUSCULAR | Status: DC | PRN
  Administered 2022-09-25: 4 mg via INTRAVENOUS

## 2022-09-25 MED ORDER — ACETAMINOPHEN 500 MG PO TABS
1000.0000 mg | ORAL_TABLET | ORAL | Status: AC
  Administered 2022-09-25: 1000 mg via ORAL

## 2022-09-25 MED ORDER — LACTATED RINGERS IV SOLN: INTRAVENOUS | Status: DC

## 2022-09-25 MED ORDER — MIDAZOLAM HCL 2 MG/2ML IJ SOLN
INTRAMUSCULAR | Status: AC
  Filled 2022-09-25: qty 2

## 2022-09-25 MED ORDER — PROPOFOL 10 MG/ML IV BOLUS
INTRAVENOUS | Status: DC | PRN
  Administered 2022-09-25: 140 mg via INTRAVENOUS

## 2022-09-25 MED ORDER — CELECOXIB 200 MG PO CAPS
200.0000 mg | ORAL_CAPSULE | Freq: Once | ORAL | Status: AC
  Administered 2022-09-25: 200 mg via ORAL

## 2022-09-25 MED ORDER — OXYCODONE HCL 5 MG PO TABS: 5.0000 mg | ORAL_TABLET | Freq: Once | ORAL | Status: DC | PRN

## 2022-09-25 MED ORDER — CELECOXIB 200 MG PO CAPS
ORAL_CAPSULE | ORAL | Status: AC
  Filled 2022-09-25: qty 1

## 2022-09-25 MED ORDER — IBUPROFEN 800 MG PO TABS
800.0000 mg | ORAL_TABLET | Freq: Three times a day (TID) | ORAL | 0 refills | Status: AC | PRN
Start: 2022-09-25 — End: ?

## 2022-09-25 MED ORDER — ACETAMINOPHEN 500 MG PO TABS
ORAL_TABLET | ORAL | Status: AC
  Filled 2022-09-25: qty 2

## 2022-09-25 MED ORDER — FENTANYL CITRATE (PF) 100 MCG/2ML IJ SOLN
INTRAMUSCULAR | Status: AC
  Filled 2022-09-25: qty 2

## 2022-09-25 MED ORDER — BUPIVACAINE HCL (PF) 0.25 % IJ SOLN
INTRAMUSCULAR | Status: DC | PRN
  Administered 2022-09-25: 20 mL

## 2022-09-25 MED ORDER — OXYCODONE HCL 5 MG PO TABS
5.0000 mg | ORAL_TABLET | Freq: Four times a day (QID) | ORAL | 0 refills | Status: AC | PRN
Start: 2022-09-25 — End: ?

## 2022-09-25 MED ORDER — LIDOCAINE 2% (20 MG/ML) 5 ML SYRINGE
INTRAMUSCULAR | Status: DC | PRN
  Administered 2022-09-25: 40 mg via INTRAVENOUS

## 2022-09-25 MED ORDER — ACETAMINOPHEN 500 MG PO TABS
500.0000 mg | ORAL_TABLET | Freq: Three times a day (TID) | ORAL | 0 refills | Status: AC | PRN
Start: 2022-09-25 — End: ?

## 2022-09-25 MED ORDER — FENTANYL CITRATE (PF) 250 MCG/5ML IJ SOLN
INTRAMUSCULAR | Status: DC | PRN
  Administered 2022-09-25: 50 ug via INTRAVENOUS

## 2022-09-25 MED ORDER — OXYCODONE HCL 5 MG/5ML PO SOLN: 5.0000 mg | Freq: Once | ORAL | Status: DC | PRN

## 2022-09-25 MED ORDER — MIDAZOLAM HCL 2 MG/2ML IJ SOLN
INTRAMUSCULAR | Status: DC | PRN
  Administered 2022-09-25: 1 mg via INTRAVENOUS

## 2022-09-25 MED ORDER — FENTANYL CITRATE (PF) 100 MCG/2ML IJ SOLN: 25.0000 ug | INTRAMUSCULAR | Status: DC | PRN

## 2022-09-25 MED ORDER — ACETAMINOPHEN 500 MG PO TABS: 1000.0000 mg | ORAL_TABLET | Freq: Once | ORAL | Status: DC

## 2022-09-25 MED ORDER — POVIDONE-IODINE 10 % EX SWAB: 2.0000 | Freq: Once | CUTANEOUS | Status: DC

## 2022-09-25 SURGICAL SUPPLY — 12 items
DEVICE MYOSURE REACH (MISCELLANEOUS) IMPLANT
GLOVE BIOGEL M 6.5 STRL (GLOVE) ×1 IMPLANT
GLOVE BIOGEL PI IND STRL 6.5 (GLOVE) ×2 IMPLANT
GLOVE BIOGEL PI IND STRL 7.0 (GLOVE) ×1 IMPLANT
GOWN STRL REUS W/TWL LRG LVL3 (GOWN DISPOSABLE) ×2 IMPLANT
IV NS IRRIG 3000ML ARTHROMATIC (IV SOLUTION) IMPLANT
KIT PROCEDURE FLUENT (KITS) ×1 IMPLANT
KIT TURNOVER CYSTO (KITS) ×1 IMPLANT
PACK VAGINAL MINOR WOMEN LF (CUSTOM PROCEDURE TRAY) ×1 IMPLANT
PAD OB MATERNITY 4.3X12.25 (PERSONAL CARE ITEMS) ×1 IMPLANT
SEAL ROD LENS SCOPE MYOSURE (ABLATOR) ×1 IMPLANT
SLEEVE SCD COMPRESS KNEE MED (STOCKING) ×1 IMPLANT

## 2022-09-25 NOTE — Anesthesia Preprocedure Evaluation (Signed)
Anesthesia Evaluation  Patient identified by MRN, date of birth, ID band Patient awake    Reviewed: Allergy & Precautions, NPO status , Patient's Chart, lab work & pertinent test results  Airway Mallampati: II  TM Distance: >3 FB Neck ROM: Full    Dental no notable dental hx.    Pulmonary neg pulmonary ROS   Pulmonary exam normal        Cardiovascular negative cardio ROS  Rhythm:Regular Rate:Normal     Neuro/Psych negative neurological ROS  negative psych ROS   GI/Hepatic negative GI ROS, Neg liver ROS,,,  Endo/Other  negative endocrine ROS    Renal/GU negative Renal ROS  negative genitourinary   Musculoskeletal  (+) Arthritis , Osteoarthritis,    Abdominal Normal abdominal exam  (+)   Peds  Hematology negative hematology ROS (+)   Anesthesia Other Findings   Reproductive/Obstetrics                             Anesthesia Physical Anesthesia Plan  ASA: 2  Anesthesia Plan: General   Post-op Pain Management: Celebrex PO (pre-op)* and Tylenol PO (pre-op)*   Induction: Intravenous  PONV Risk Score and Plan: 3 and Ondansetron, Dexamethasone, Midazolam and Treatment may vary due to age or medical condition  Airway Management Planned: Mask and LMA  Additional Equipment: None  Intra-op Plan:   Post-operative Plan: Extubation in OR  Informed Consent: I have reviewed the patients History and Physical, chart, labs and discussed the procedure including the risks, benefits and alternatives for the proposed anesthesia with the patient or authorized representative who has indicated his/her understanding and acceptance.     Dental advisory given  Plan Discussed with: CRNA  Anesthesia Plan Comments:        Anesthesia Quick Evaluation

## 2022-09-25 NOTE — H&P (Signed)
Date of Initial H&P: 09/25/2022  History reviewed, patient examined, no change in status, stable for surgery.

## 2022-09-25 NOTE — Transfer of Care (Signed)
Immediate Anesthesia Transfer of Care Note  Patient: Kathryn Rose  Procedure(s) Performed: DILATATION & CURETTAGE/HYSTEROSCOPY WITH MYOSURE  Patient Location: PACU  Anesthesia Type:General  Level of Consciousness: awake, alert , and patient cooperative  Airway & Oxygen Therapy: Patient Spontanous Breathing and Patient connected to nasal cannula oxygen  Post-op Assessment: Report given to RN and Post -op Vital signs reviewed and stable  Post vital signs: Reviewed and stable  Last Vitals:  Vitals Value Taken Time  BP 144/72 09/25/22 1451  Temp    Pulse 70 09/25/22 1453  Resp 15 09/25/22 1453  SpO2 100 % 09/25/22 1453  Vitals shown include unvalidated device data.  Last Pain:  Vitals:   09/25/22 1258  TempSrc: Oral  PainSc: 0-No pain      Patients Stated Pain Goal: 5 (09/25/22 1258)  Complications: No notable events documented.

## 2022-09-25 NOTE — Op Note (Signed)
09/25/2022  2:49 PM  PATIENT:  Kathryn Rose  62 y.o. female  PRE-OPERATIVE DIAGNOSIS:  Thickened Endometrium Endometrial Mass Postmenopausal Bleeding  POST-OPERATIVE DIAGNOSIS:  Thickened EndometriumEndometrial MassPostmenopausal Bleeding  PROCEDURE:  Procedure(s): DILATATION & CURETTAGE/HYSTEROSCOPY WITH MYOSURE (N/A)  SURGEON:  Surgeon(s) and Role:    Gerald Leitz, MD - Primary  PHYSICIAN ASSISTANT:   ASSISTANTS: none   ANESTHESIA:   general  EBL:  5 mL   BLOOD ADMINISTERED:none  DRAINS: none   LOCAL MEDICATIONS USED:  MARCAINE     SPECIMEN:  Source of Specimen:  endometrial mass/ polyp and currettings   DISPOSITION OF SPECIMEN:  PATHOLOGY  COUNTS:  YES  TOURNIQUET:  * No tourniquets in log *  DICTATION: .Note written in EPIC  PLAN OF CARE: Discharge to home after PACU  PATIENT DISPOSITION:  PACU - hemodynamically stable.   Delay start of Pharmacological VTE agent (>24hrs) due to surgical blood loss or risk of bleeding: not applicable  Findings: normal external genitalia, vaginal mucosa and cervix. Endometrial polyp located in the lower endometrial segment.   Procedure: Patient was taken to the operating room #5 [facility] where she was placed under general anesthesia. She was placed in the dorsal lithotomy position. She was prepped and draped in the usual sterile fashion. A speculum was placed into the vaginal vault. The anterior lip of the cervix was grasped with a single-tooth tenaculum. Quarter percent Marcaine was injected at the 4 and 8:00 positions of the cervix. The cervix was then sounded to 7 cm. The cervix was dilated to approximately 6 mm. Mysosure operative  hysteroscope was inserted. The findings noted above. Myosure reach  blade was introduced throught the hysteroscope. The endometrial mass was removed in 5 minute.  There was no evidence of perforation. The hysteroscope was removed.  The single-tooth tenaculum was removed from the anterior lip of the  cervix.  Excellent hemostasis was noted. The speculum was removed from the patient's vagina. She was awakened from anesthesia and taken to the recovery  room awake and in stable condition. Sponge lap and needle counts were correct x2.

## 2022-09-25 NOTE — Anesthesia Postprocedure Evaluation (Signed)
Anesthesia Post Note  Patient: Kathryn Rose  Procedure(s) Performed: DILATATION & CURETTAGE/HYSTEROSCOPY WITH MYOSURE     Patient location during evaluation: PACU Anesthesia Type: General Level of consciousness: awake and alert Pain management: pain level controlled Vital Signs Assessment: post-procedure vital signs reviewed and stable Respiratory status: spontaneous breathing, nonlabored ventilation, respiratory function stable and patient connected to nasal cannula oxygen Cardiovascular status: blood pressure returned to baseline and stable Postop Assessment: no apparent nausea or vomiting Anesthetic complications: no   No notable events documented.  Last Vitals:  Vitals:   09/25/22 1515 09/25/22 1544  BP: 136/68 (!) 152/61  Pulse:  70  Resp:  14  Temp: 36.4 C 36.5 C  SpO2:  99%    Last Pain:  Vitals:   09/25/22 1544  TempSrc:   PainSc: 2                  Earl Lites P Maddalyn Lutze

## 2022-09-25 NOTE — Anesthesia Procedure Notes (Signed)
Procedure Name: LMA Insertion Date/Time: 09/25/2022 2:17 PM  Performed by: Earmon Phoenix, CRNAPre-anesthesia Checklist: Patient identified, Emergency Drugs available, Suction available, Patient being monitored and Timeout performed Patient Re-evaluated:Patient Re-evaluated prior to induction Oxygen Delivery Method: Circle system utilized Preoxygenation: Pre-oxygenation with 100% oxygen Induction Type: IV induction Ventilation: Mask ventilation without difficulty LMA: LMA inserted LMA Size: 3.0 Placement Confirmation: positive ETCO2 and breath sounds checked- equal and bilateral Tube secured with: Tape Dental Injury: Teeth and Oropharynx as per pre-operative assessment

## 2022-09-25 NOTE — Discharge Instructions (Addendum)
  Post Anesthesia Home Care Instructions  Activity: Get plenty of rest for the remainder of the day. A responsible individual must stay with you for 24 hours following the procedure.  For the next 24 hours, DO NOT: -Drive a car -Operate machinery -Drink alcoholic beverages -Take any medication unless instructed by your physician -Make any legal decisions or sign important papers.  Meals: Start with liquid foods such as gelatin or soup. Progress to regular foods as tolerated. Avoid greasy, spicy, heavy foods. If nausea and/or vomiting occur, drink only clear liquids until the nausea and/or vomiting subsides. Call your physician if vomiting continues.  Special Instructions/Symptoms: Your throat may feel dry or sore from the anesthesia or the breathing tube placed in your throat during surgery. If this causes discomfort, gargle with warm salt water. The discomfort should disappear within 24 hours.      D & C Home care Instructions:   Personal hygiene:  Used sanitary napkins for vaginal drainage not tampons. Shower or tub bathe the day after your procedure. No douching until bleeding stops. Always wipe from front to back after  Elimination.  Activity: Do not drive or operate any equipment today. The effects of the anesthesia are still present and drowsiness may result. Rest today, not necessarily flat bed rest, just take it easy. You may resume your normal activity in one to 2 days.  Sexual activity: No intercourse for one week or as indicated by your physician  Diet: Eat a light diet as desired this evening. You may resume a regular diet tomorrow.  Return to work: One to 2 days.  General Expectations of your surgery: Vaginal bleeding should be no heavier than a normal period. Spotting may continue up to 10 days. Mild cramps may continue for a couple of days. You may have a regular period in 2-6 weeks.  Unexpected observations call your doctor if these occur: persistent or heavy  bleeding. Severe abdominal cramping or pain. Elevation of temperature greater than 100F.    

## 2022-09-26 ENCOUNTER — Encounter (HOSPITAL_BASED_OUTPATIENT_CLINIC_OR_DEPARTMENT_OTHER): Payer: Self-pay | Admitting: Obstetrics and Gynecology

## 2022-09-26 LAB — SURGICAL PATHOLOGY

## 2022-11-11 ENCOUNTER — Other Ambulatory Visit: Payer: Self-pay | Admitting: Obstetrics and Gynecology

## 2022-11-11 DIAGNOSIS — Z0000 Encounter for general adult medical examination without abnormal findings: Secondary | ICD-10-CM

## 2022-12-10 ENCOUNTER — Other Ambulatory Visit (HOSPITAL_COMMUNITY)
Admission: RE | Admit: 2022-12-10 | Discharge: 2022-12-10 | Disposition: A | Discharge status: Home / Self Care | Payer: Managed Care, Other (non HMO) | Source: Ambulatory Visit | Attending: Obstetrics and Gynecology | Admitting: Obstetrics and Gynecology

## 2022-12-10 DIAGNOSIS — Z01419 Encounter for gynecological examination (general) (routine) without abnormal findings: Secondary | ICD-10-CM | POA: Insufficient documentation

## 2022-12-11 DIAGNOSIS — Z1231 Encounter for screening mammogram for malignant neoplasm of breast: Secondary | ICD-10-CM

## 2022-12-12 LAB — CYTOLOGY - PAP
Comment: NEGATIVE
Diagnosis: NEGATIVE
High risk HPV: NEGATIVE

## 2022-12-17 ENCOUNTER — Ambulatory Visit
Admission: RE | Admit: 2022-12-17 | Discharge: 2022-12-17 | Disposition: A | Discharge status: Home / Self Care | Payer: Managed Care, Other (non HMO) | Source: Ambulatory Visit | Attending: Obstetrics and Gynecology | Admitting: Obstetrics and Gynecology

## 2022-12-17 DIAGNOSIS — Z0000 Encounter for general adult medical examination without abnormal findings: Secondary | ICD-10-CM

## 2023-11-03 ENCOUNTER — Other Ambulatory Visit: Payer: Self-pay | Admitting: Obstetrics and Gynecology

## 2023-11-03 DIAGNOSIS — Z1231 Encounter for screening mammogram for malignant neoplasm of breast: Secondary | ICD-10-CM

## 2023-12-22 ENCOUNTER — Ambulatory Visit

## 2023-12-23 ENCOUNTER — Ambulatory Visit
Admission: RE | Admit: 2023-12-23 | Discharge: 2023-12-23 | Disposition: A | Discharge status: Home / Self Care | Source: Ambulatory Visit | Attending: Obstetrics and Gynecology | Admitting: Obstetrics and Gynecology

## 2023-12-23 DIAGNOSIS — Z1231 Encounter for screening mammogram for malignant neoplasm of breast: Secondary | ICD-10-CM
